# Patient Record
Sex: Female | Born: 1995 | Race: Black or African American | Hispanic: No | Marital: Single | Smoking: Never smoker
Health system: Southern US, Community
[De-identification: ages and names within clinical notes are randomized; demographics above are authoritative.]

---

## 2017-05-18 ENCOUNTER — Emergency Department (HOSPITAL_COMMUNITY)
Admission: EM | Admit: 2017-05-18 | Discharge: 2017-05-19 | Disposition: A | Discharge status: Home / Self Care | Payer: Self-pay | Attending: Emergency Medicine | Admitting: Emergency Medicine

## 2017-05-18 ENCOUNTER — Emergency Department (HOSPITAL_COMMUNITY): Payer: Self-pay

## 2017-05-18 ENCOUNTER — Encounter (HOSPITAL_COMMUNITY): Payer: Self-pay

## 2017-05-18 DIAGNOSIS — R0602 Shortness of breath: Secondary | ICD-10-CM | POA: Insufficient documentation

## 2017-05-18 DIAGNOSIS — R079 Chest pain, unspecified: Secondary | ICD-10-CM | POA: Insufficient documentation

## 2017-05-18 NOTE — ED Triage Notes (Signed)
Pt complains of chest wall pain across her chest for several weeks, sometimes she states that she's short of breath Pt smells of strongly of marijuana but denies use
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
## 2017-05-19 LAB — D-DIMER, QUANTITATIVE: D-Dimer, Quant: 0.28 ug/mL-FEU (ref 0.00–0.50)

## 2017-05-19 LAB — BASIC METABOLIC PANEL
Anion gap: 8 (ref 5–15)
BUN: 11 mg/dL (ref 6–20)
CHLORIDE: 108 mmol/L (ref 101–111)
CO2: 22 mmol/L (ref 22–32)
CREATININE: 0.76 mg/dL (ref 0.44–1.00)
Calcium: 9.2 mg/dL (ref 8.9–10.3)
GFR calc Af Amer: >60 mL/min (ref >60)
GFR calc non Af Amer: >60 mL/min (ref >60)
GLUCOSE: 88 mg/dL (ref 65–99)
POTASSIUM: 3.6 mmol/L (ref 3.5–5.1)
Sodium: 138 mmol/L (ref 135–145)

## 2017-05-19 LAB — CBC
HCT: 40.6 % (ref 36.0–46.0)
Hemoglobin: 13.7 g/dL (ref 12.0–15.0)
MCH: 31.5 pg (ref 26.0–34.0)
MCHC: 33.7 g/dL (ref 30.0–36.0)
MCV: 93.3 fL (ref 78.0–100.0)
Platelets: 339 10*3/uL (ref 150–400)
RBC: 4.35 MIL/uL (ref 3.87–5.11)
RDW: 13.5 % (ref 11.5–15.5)
WBC: 11.3 10*3/uL — AB (ref 4.0–10.5)

## 2017-05-19 MED ORDER — NAPROXEN 500 MG PO TABS: 500.0000 mg | ORAL_TABLET | Freq: Two times a day (BID) | ORAL | 0 refills | Status: DC
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
## 2017-05-23 ENCOUNTER — Other Ambulatory Visit: Payer: Self-pay

## 2017-05-23 ENCOUNTER — Emergency Department (HOSPITAL_COMMUNITY)
Admission: EM | Admit: 2017-05-23 | Discharge: 2017-05-23 | Disposition: A | Discharge status: Home / Self Care | Payer: Self-pay | Attending: Emergency Medicine | Admitting: Emergency Medicine

## 2017-05-23 ENCOUNTER — Encounter (HOSPITAL_COMMUNITY): Payer: Self-pay | Admitting: Emergency Medicine

## 2017-05-23 ENCOUNTER — Emergency Department (HOSPITAL_COMMUNITY): Payer: Self-pay

## 2017-05-23 DIAGNOSIS — W01198A Fall on same level from slipping, tripping and stumbling with subsequent striking against other object, initial encounter: Secondary | ICD-10-CM | POA: Insufficient documentation

## 2017-05-23 DIAGNOSIS — S0990XA Unspecified injury of head, initial encounter: Secondary | ICD-10-CM | POA: Insufficient documentation

## 2017-05-23 DIAGNOSIS — R51 Headache: Secondary | ICD-10-CM | POA: Insufficient documentation

## 2017-05-23 DIAGNOSIS — Y9389 Activity, other specified: Secondary | ICD-10-CM | POA: Insufficient documentation

## 2017-05-23 DIAGNOSIS — Y9289 Other specified places as the place of occurrence of the external cause: Secondary | ICD-10-CM | POA: Insufficient documentation

## 2017-05-23 DIAGNOSIS — R519 Headache, unspecified: Secondary | ICD-10-CM

## 2017-05-23 DIAGNOSIS — Y998 Other external cause status: Secondary | ICD-10-CM | POA: Insufficient documentation

## 2017-05-23 LAB — POC URINE PREG, ED: PREG TEST UR: NEGATIVE

## 2017-05-23 MED ORDER — IBUPROFEN 200 MG PO TABS
600.0000 mg | ORAL_TABLET | Freq: Once | ORAL | Status: AC
  Administered 2017-05-23: 600 mg via ORAL
  Filled 2017-05-23: qty 3

## 2017-05-23 MED ORDER — ACETAMINOPHEN 325 MG PO TABS
650.0000 mg | ORAL_TABLET | Freq: Once | ORAL | Status: AC
  Administered 2017-05-23: 650 mg via ORAL
  Filled 2017-05-23: qty 2
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
## 2017-05-23 NOTE — Discharge Instructions (Signed)
Please see the information and instructions below regarding your visit.  Your diagnoses today include:  1. Injury of head, initial encounter   2. Facial pain    Concussions are caused by acceleration/deceleration forces of the brain against the skull and in mild forms, cannot be seen on any imaging. The injury occurs at the microscopic level, and causes a disturbance more in function than the structure of the brain itself. Common symptoms of concussion include: ?Poor coordination such as stumbling or inability to walk in a straight line ?Vacant stare (befuddled facial expression) ?Delayed verbal expression (slower to answer questions or follow instructions) ?Inability to focus attention (easily distracted and unable to follow through with normal activities) ?Disorientation (walking in the wrong direction, unaware of time, date, place) ?Slurred or incoherent speech (making disjointed or incomprehensible statements) ?Emotionality out of proportion to circumstances (appearing distraught, crying for no apparent reason) ?Memory deficits (exhibited by patient repeatedly asking the same question that has already been answered or inability to recall three of three words after five minutes)  Tests performed today include:  See side panel of your discharge paperwork for testing performed today. Vital signs are listed at the bottom of these instructions.   Medications prescribed:    Avoid aspirin. Take only ibuprofen (Advil) or naproxen (Aleve), and acetaminophen (Tylenol).  Take any prescribed medications only as prescribed, and any over the counter medications only as directed on the packaging.  Home care instructions:  Please follow any educational materials contained in this packet.    Keep head elevated at all times for the first 24 hours (Elevate mattress if pillow is ineffective)  Do not take tranquilizers, sedatives, narcotics or alcohol  Use ice packs for comfort Have parent, spouse,  or friend awaken patient every  Follow-up instructions: Please follow-up with your primary care provider in as soon as possible for further evaluation of your symptoms if they are not completely improved.   Return instructions:  Please return to the Emergency Department if you experience worsening symptoms.   If any of the following occur notify your physician or go to the Hospital Emergency Department:  Increased drowsiness, confusion, or loss of consciousness  Restlessness or convulsions (fits)  Paralysis in arms or legs  Temperature above 100 F  Vomiting  Severe headache  Blood or clear fluid dripping from the nose or ears  Stiffness of the neck  Dizziness or blurred vision  Pulsating pain in the eye  Unequal pupils of eye  Personality changes  Any other unusual symptoms  Please return if you have any other emergent concerns.  Additional Information:   Your vital signs today were: BP 140/83 (BP Location: Left Arm)    Pulse 95    Temp 98.2 F (36.8 C) (Oral)    Resp 15    Ht 5\' 2"  (1.575 m)    Wt 68 kg (150 lb)    LMP 04/27/2017    SpO2 94%    BMI 27.44 kg/m  If your blood pressure (BP) was elevated on multiple readings during this visit above 130 for the top number or above 80 for the bottom number, please have this repeated by your primary care provider within one month. --------------  Thank you for allowing us to participate in your care today. It was my pleasure to care for you!

## 2017-05-23 NOTE — ED Triage Notes (Signed)
Patient tripped over some wires and fell. Patient hit her head on the left side. Patient has some redness on her left cheek. Patient states she is scared to go to sleep due to her hitting her head.
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
## 2017-06-08 ENCOUNTER — Other Ambulatory Visit: Payer: Self-pay

## 2017-06-08 ENCOUNTER — Encounter (HOSPITAL_COMMUNITY): Payer: Self-pay | Admitting: Emergency Medicine

## 2017-06-08 DIAGNOSIS — Z5321 Procedure and treatment not carried out due to patient leaving prior to being seen by health care provider: Secondary | ICD-10-CM | POA: Insufficient documentation

## 2017-06-08 LAB — COMPREHENSIVE METABOLIC PANEL
ALBUMIN: 4 g/dL (ref 3.5–5.0)
ALK PHOS: 71 U/L (ref 38–126)
ALT: 21 U/L (ref 14–54)
ANION GAP: 8 (ref 5–15)
AST: 27 U/L (ref 15–41)
BUN: 11 mg/dL (ref 6–20)
CO2: 26 mmol/L (ref 22–32)
Calcium: 9.3 mg/dL (ref 8.9–10.3)
Chloride: 103 mmol/L (ref 101–111)
Creatinine, Ser: 0.72 mg/dL (ref 0.44–1.00)
GFR calc Af Amer: >60 mL/min (ref >60)
GFR calc non Af Amer: >60 mL/min (ref >60)
GLUCOSE: 95 mg/dL (ref 65–99)
POTASSIUM: 3.6 mmol/L (ref 3.5–5.1)
SODIUM: 137 mmol/L (ref 135–145)
Total Bilirubin: 0.2 mg/dL — ABNORMAL LOW (ref 0.3–1.2)
Total Protein: 7.7 g/dL (ref 6.5–8.1)

## 2017-06-08 LAB — CBC
HEMATOCRIT: 40.4 % (ref 36.0–46.0)
HEMOGLOBIN: 13.4 g/dL (ref 12.0–15.0)
MCH: 31.3 pg (ref 26.0–34.0)
MCHC: 33.2 g/dL (ref 30.0–36.0)
MCV: 94.4 fL (ref 78.0–100.0)
PLATELETS: 369 10*3/uL (ref 150–400)
RBC: 4.28 MIL/uL (ref 3.87–5.11)
RDW: 13.6 % (ref 11.5–15.5)
WBC: 13.3 10*3/uL — ABNORMAL HIGH (ref 4.0–10.5)

## 2017-06-08 LAB — LIPASE, BLOOD: LIPASE: 28 U/L (ref 11–51)

## 2017-06-08 LAB — I-STAT CG4 LACTIC ACID, ED: LACTIC ACID, VENOUS: 1.33 mmol/L (ref 0.5–1.9)

## 2017-06-08 NOTE — ED Notes (Signed)
Patient was called for an assigned room with no answer.  

## 2017-06-08 NOTE — ED Triage Notes (Signed)
Called pt from lobby No respsonse 

## 2017-06-08 NOTE — ED Triage Notes (Signed)
CALLED PT FOR RECHECK OF V/S NO RESPONSE PT SEEN LEAVING THE ED
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
## 2017-06-08 NOTE — ED Triage Notes (Signed)
PT HAS URINE SAMPLE IN TRIAGE WHEN NEEDED

## 2017-06-08 NOTE — ED Notes (Signed)
Pt called  No response from lobby  

## 2017-06-09 ENCOUNTER — Emergency Department (HOSPITAL_COMMUNITY)
Admission: EM | Admit: 2017-06-09 | Discharge: 2017-06-09 | Discharge status: Against Medical Advice | Payer: Self-pay | Attending: Emergency Medicine | Admitting: Emergency Medicine

## 2017-07-27 ENCOUNTER — Ambulatory Visit (HOSPITAL_COMMUNITY)
Admission: EM | Admit: 2017-07-27 | Discharge: 2017-07-27 | Disposition: A | Discharge status: Home / Self Care | Payer: Self-pay | Attending: Family Medicine | Admitting: Family Medicine

## 2017-07-27 ENCOUNTER — Encounter (HOSPITAL_COMMUNITY): Payer: Self-pay | Admitting: Emergency Medicine

## 2017-07-27 DIAGNOSIS — K529 Noninfective gastroenteritis and colitis, unspecified: Secondary | ICD-10-CM

## 2017-07-27 MED ORDER — ONDANSETRON 4 MG PO TBDP: 4.0000 mg | ORAL_TABLET | Freq: Three times a day (TID) | ORAL | 0 refills | Status: DC | PRN

## 2017-07-27 NOTE — ED Provider Notes (Signed)
[facility]   [id number] 07/27/17 Arrival Time: 1413  ASSESSMENT & PLAN:  1. Gastroenteritis     Meds ordered this encounter  Medications  . ondansetron (ZOFRAN-ODT) 4 MG disintegrating tablet    Sig: Take 1 tablet (4 mg total) by mouth every 8 (eight) hours as needed for nausea or vomiting.    Dispense:  15 tablet    Refill:  0   Discussed typical duration of symptoms for suspected viral GI illness. Will do her best to ensure adequate fluid intake in order to avoid dehydration. Will proceed to the Emergency Department for evaluation if unable to tolerate PO fluids regularly.  Otherwise she will f/u with his PCP or here if not showing improvement over the next 48-72 hours.  Reviewed expectations re: course of current medical issues. Questions answered. Outlined signs and symptoms indicating need for more acute intervention. Patient verbalized understanding. After Visit Summary given.   SUBJECTIVE: History from: patient.  Kara Benitez is a 22 y.o. female who presents with complaint of non-bloody intermittent nausea and vomiting of brown material with loose, non-bloody stools. Onset abrupt, 2 days ago. Abdominal discomfort: mild and cramping. Symptoms are gradually improving since beginning. Aggravating factors: eating. Alleviating factors: none. Associated symptoms: fatigue. She denies fever. Appetite: decreased. PO intake: decreased. Ambulatory without assistance. Urinary symptoms: none. OTC treatment: none.  LMP 2 weeks ago; regular.  History reviewed. No pertinent surgical history.  ROS: As per HPI.  OBJECTIVE:  Vitals:   07/27/17 1524  BP: (!) 97/56  Pulse: 100  Resp: 18  Temp: 98.2 F (36.8 C)  TempSrc: Oral  SpO2: 97%    General appearance: alert; no distress Oropharynx: moist Lungs: clear to auscultation bilaterally Heart: regular rate and rhythm Abdomen: soft; non-distended; no significant abdominal tenderness, "cramping feeling"; bowel  sounds present; no masses or organomegaly; no guarding or rebound tenderness Back: no CVA tenderness Extremities: no edema; symmetrical with no gross deformities Skin: warm and dry Neurologic: normal gait Psychological: alert and cooperative; normal mood and affect  No Known Allergies                                              Social History   Socioeconomic History  . Marital status: Single    Spouse name: Not on file  . Number of children: Not on file  . Years of education: Not on file  . Highest education level: Not on file  Occupational History  . Not on file  Social Needs  . Financial resource strain: Not on file  . Food insecurity:    Worry: Not on file    Inability: Not on file  . Transportation needs:    Medical: Not on file    Non-medical: Not on file  Tobacco Use  . Smoking status: Never Smoker  . Smokeless tobacco: Never Used  Substance and Sexual Activity  . Alcohol use: Yes    Frequency: Never  . Drug use: Yes    Types: Marijuana  . Sexual activity: Not on file  Lifestyle  . Physical activity:    Days per week: Not on file    Minutes per session: Not on file  . Stress: Not on file  Relationships  . Social connections:    Talks on phone: Not on file    Gets together: Not on file    Attends religious  service: Not on file    Active member of club or organization: Not on file    Attends meetings of clubs or organizations: Not on file    Relationship status: Not on file  . Intimate partner violence:    Fear of current or ex partner: Not on file    Emotionally abused: Not on file    Physically abused: Not on file    Forced sexual activity: Not on file  Other Topics Concern  . Not on file  Social History Narrative  . Not on file   History reviewed. No pertinent family history.   Mardella Layman, MD 07/28/17 [phone]

## 2017-07-27 NOTE — Discharge Instructions (Signed)

## 2017-07-27 NOTE — ED Triage Notes (Signed)
Pt here for vomiting x 2 days  

## 2017-09-08 ENCOUNTER — Encounter (HOSPITAL_COMMUNITY): Payer: Self-pay | Admitting: *Deleted

## 2017-09-08 ENCOUNTER — Emergency Department (HOSPITAL_COMMUNITY)
Admission: EM | Admit: 2017-09-08 | Discharge: 2017-09-08 | Disposition: A | Discharge status: Home / Self Care | Payer: Self-pay | Attending: Emergency Medicine | Admitting: Emergency Medicine

## 2017-09-08 ENCOUNTER — Other Ambulatory Visit: Payer: Self-pay

## 2017-09-08 DIAGNOSIS — J029 Acute pharyngitis, unspecified: Secondary | ICD-10-CM | POA: Insufficient documentation

## 2017-09-08 DIAGNOSIS — Z79899 Other long term (current) drug therapy: Secondary | ICD-10-CM | POA: Insufficient documentation

## 2017-09-08 LAB — GROUP A STREP BY PCR: GROUP A STREP BY PCR: NOT DETECTED

## 2017-09-08 MED ORDER — DEXAMETHASONE SODIUM PHOSPHATE 10 MG/ML IJ SOLN
10.0000 mg | Freq: Once | INTRAMUSCULAR | Status: AC
  Administered 2017-09-08: 10 mg via INTRAMUSCULAR
  Filled 2017-09-08: qty 1

## 2017-09-08 NOTE — ED Triage Notes (Signed)
Pt c/o sore throat since yesterday.  Pt stated "I feel like something in there is bruised."

## 2017-09-08 NOTE — Discharge Instructions (Addendum)
Take Tylenol or Motrin for your sore throat.  Drink plenty of fluids.  Follow-up with your doctor as needed.  Return if worsening symptoms.  You were given a shot of steroids which should help with the swelling in your throat as well as her headache.
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
## 2017-09-08 NOTE — ED Notes (Signed)
Pt attempted to sign, signature pad not working. Pt verbalizes understanding d/c paperwork.

## 2017-09-14 ENCOUNTER — Encounter (HOSPITAL_COMMUNITY): Payer: Self-pay | Admitting: Emergency Medicine

## 2017-09-14 ENCOUNTER — Emergency Department (HOSPITAL_COMMUNITY)
Admission: EM | Admit: 2017-09-14 | Discharge: 2017-09-14 | Disposition: A | Discharge status: Home / Self Care | Payer: Self-pay | Attending: Emergency Medicine | Admitting: Emergency Medicine

## 2017-09-14 ENCOUNTER — Emergency Department (HOSPITAL_COMMUNITY): Payer: Self-pay

## 2017-09-14 DIAGNOSIS — J029 Acute pharyngitis, unspecified: Secondary | ICD-10-CM | POA: Insufficient documentation

## 2017-09-14 DIAGNOSIS — M542 Cervicalgia: Secondary | ICD-10-CM | POA: Insufficient documentation
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
## 2017-09-14 NOTE — ED Notes (Signed)
Patient verbalized understanding of discharge instructions, no questions. Patient ambulated out of ED with steady gait in no distress.  

## 2017-09-14 NOTE — Discharge Instructions (Signed)
Return if any problems.

## 2018-04-21 ENCOUNTER — Encounter (HOSPITAL_COMMUNITY): Payer: Self-pay | Admitting: Emergency Medicine

## 2018-04-21 ENCOUNTER — Ambulatory Visit (HOSPITAL_COMMUNITY)
Admission: EM | Admit: 2018-04-21 | Discharge: 2018-04-21 | Disposition: A | Discharge status: Home / Self Care | Payer: Self-pay | Attending: Family Medicine | Admitting: Family Medicine

## 2018-04-21 DIAGNOSIS — R0789 Other chest pain: Secondary | ICD-10-CM

## 2018-04-21 MED ORDER — DICLOFENAC SODIUM 1 % TD GEL: 2.0000 g | Freq: Four times a day (QID) | TRANSDERMAL | 1 refills | Status: AC

## 2018-04-21 MED ORDER — KETOROLAC TROMETHAMINE 60 MG/2ML IM SOLN
60.0000 mg | Freq: Once | INTRAMUSCULAR | Status: AC
  Administered 2018-04-21: 60 mg via INTRAMUSCULAR

## 2018-04-21 MED ORDER — MELOXICAM 7.5 MG PO TABS: 7.5000 mg | ORAL_TABLET | Freq: Two times a day (BID) | ORAL | 1 refills | Status: AC | PRN

## 2018-04-21 MED ORDER — KETOROLAC TROMETHAMINE 60 MG/2ML IM SOLN
INTRAMUSCULAR | Status: AC
  Filled 2018-04-21: qty 2

## 2018-04-21 NOTE — ED Provider Notes (Signed)
[facility]    CSN: [id number] Arrival date & time: 04/21/18  1229     History   Chief Complaint Chief Complaint  Patient presents with  . Chest Pain    HPI Kara Benitez is a 23 y.o. female.   23 year old female accompanied by a significant other with concern over upper bilateral chest pain and soreness that occurs intermittently for the past 2 years. Pain starts in the upper right and left chest and remains constant with occasional sharp pain. Pain started last night and woke her up. Other symptoms include feeling short of breath, hot flashes and throat feeling tight. Denies any fever, nasal congestion, sore throat, cough, nausea, vomiting or diarrhea. Occasionally will have dizziness with the pain and today felt dizzy at work and almost passed out. She has been evaluated in the past in the ER in [place] and moved here to [place] about 1 year ago. She has been evaluated in the ER at [facility] in Feb 2019 for similar symptoms. In the past her ECG, chest x-ray and labwork have all been normal. She has even had a MRI of her chest in [place] and patient indicates results were "normal". She has been given an anti-inflammatory in the past (uncertain of name) with minimal relief. She does not smoke and rarely drinks alcohol. She denies any recent illicit drug use (has used marijuana in the past). No family history of cardiac or lung issues. Mom has history of anxiety. Patient does not have a PCP yet and is frustrated that "no one can figure out what is wrong with me".    The history is provided by the patient.    History reviewed. No pertinent past medical history.  There are no active problems to display for this patient.   History reviewed. No pertinent surgical history.  OB History   No obstetric history on file.      Home Medications    Prior to Admission medications   Medication Sig Start Date End Date Taking? Authorizing Provider  diclofenac sodium  (VOLTAREN) 1 % GEL Apply 2 g topically 4 (four) times daily. To affected area as needed 04/21/18   Sudie Grumbling, NP  meloxicam (MOBIC) 7.5 MG tablet Take 1 tablet (7.5 mg total) by mouth 2 (two) times daily as needed for pain. 04/21/18   Sudie Grumbling, NP    Family History No family history on file.  Social History Social History   Tobacco Use  . Smoking status: Never Smoker  . Smokeless tobacco: Never Used  Substance Use Topics  . Alcohol use: Yes    Frequency: Never  . Drug use: Yes    Types: Marijuana     Allergies   Patient has no known allergies.   Review of Systems Review of Systems  Constitutional: Negative for activity change, appetite change, chills, diaphoresis, fatigue, fever and unexpected weight change.  HENT: Negative for congestion, facial swelling, mouth sores, nosebleeds, postnasal drip, sore throat and trouble swallowing.   Eyes: Negative for photophobia and visual disturbance.  Respiratory: Positive for chest tightness and shortness of breath. Negative for cough, wheezing and stridor.   Cardiovascular: Positive for chest pain. Negative for palpitations and leg swelling.  Gastrointestinal: Negative for abdominal pain, diarrhea, nausea and vomiting.  Genitourinary: Negative for decreased urine volume, difficulty urinating, flank pain and hematuria.  Musculoskeletal: Negative for arthralgias, back pain, myalgias, neck pain and neck stiffness.  Skin: Negative for color change, rash and wound.  Allergic/Immunologic:  Negative for environmental allergies and immunocompromised state.  Neurological: Positive for dizziness and light-headedness. Negative for tremors, seizures, syncope, facial asymmetry, speech difficulty, weakness, numbness and headaches.  Hematological: Negative for adenopathy. Does not bruise/bleed easily.     Physical Exam Triage Vital Signs ED Triage Vitals  Enc Vitals Group     BP 04/21/18 1312 127/86     Pulse Rate 04/21/18 1312 87      Resp 04/21/18 1312 18     Temp 04/21/18 1312 98.3 F (36.8 C)     Temp src --      SpO2 04/21/18 1312 100 %     Weight --      Height --      Head Circumference --      Peak Flow --      Pain Score 04/21/18 1313 9     Pain Loc --      Pain Edu? --      Excl. in GC? --    No data found.  Updated Vital Signs BP 127/86   Pulse 87   Temp 98.3 F (36.8 C)   Resp 18   LMP 03/22/2018   SpO2 100%   Visual Acuity Right Eye Distance:   Left Eye Distance:   Bilateral Distance:    Right Eye Near:   Left Eye Near:    Bilateral Near:     Physical Exam Vitals signs and nursing note reviewed.  Constitutional:      General: She is awake. She is not in acute distress.    Appearance: Normal appearance. She is well-developed, well-groomed and normal weight. She is not ill-appearing.     Comments: Patient is sitting comfortably in exam chair in no acute distress but is wearing a mask to prevent exposure to flu and other illnesses from patients around her. She does appear in pain.   HENT:     Head: Normocephalic and atraumatic.     Right Ear: Hearing, tympanic membrane, ear canal and external ear normal.     Left Ear: Hearing, tympanic membrane, ear canal and external ear normal.     Nose: Nose normal.     Right Sinus: No maxillary sinus tenderness or frontal sinus tenderness.     Left Sinus: No maxillary sinus tenderness or frontal sinus tenderness.     Mouth/Throat:     Lips: Pink.     Mouth: Mucous membranes are moist.     Pharynx: Oropharynx is clear. Uvula midline. No pharyngeal swelling.  Eyes:     General: Lids are normal.     Extraocular Movements: Extraocular movements intact.     Conjunctiva/sclera: Conjunctivae normal.     Pupils: Pupils are equal, round, and reactive to light.  Neck:     Musculoskeletal: Normal range of motion and neck supple. No neck rigidity or muscular tenderness.  Cardiovascular:     Rate and Rhythm: Normal rate and regular rhythm.     Pulses:  Normal pulses.     Heart sounds: Normal heart sounds. No murmur.  Pulmonary:     Effort: Pulmonary effort is normal. No accessory muscle usage or respiratory distress.     Breath sounds: Normal breath sounds and air entry. No decreased air movement. No decreased breath sounds, wheezing, rhonchi or rales.     Comments: Slight discomfort in upper chest when taking a deep breath.  Chest:     Chest wall: Tenderness present. No mass, deformity, swelling, crepitus or edema.  Comments: Tender to palpation along upper sternal area across 2nd-3rd ribs bilaterally. No distinct swelling or redness present.  Musculoskeletal: Normal range of motion.     Right lower leg: No edema.     Left lower leg: No edema.  Lymphadenopathy:     Cervical: No cervical adenopathy.  Skin:    General: Skin is warm and dry.     Capillary Refill: Capillary refill takes less than 2 seconds.     Findings: No rash.  Neurological:     General: No focal deficit present.     Mental Status: She is alert and oriented to person, place, and time.     Cranial Nerves: Cranial nerves are intact.     Sensory: Sensation is intact.     Motor: Motor function is intact.     Coordination: Coordination is intact.     Gait: Gait is intact.     Deep Tendon Reflexes: Reflexes are normal and symmetric.  Psychiatric:        Attention and Perception: Attention normal.        Mood and Affect: Mood and affect normal.        Speech: Speech normal.        Behavior: Behavior normal. Behavior is cooperative.        Thought Content: Thought content normal.        Cognition and Memory: Cognition normal.      UC Treatments / Results  Labs (all labs ordered are listed, but only abnormal results are displayed) Labs Reviewed - No data to display  EKG None  Radiology No results found.  Procedures Procedures (including critical care time)  Medications Ordered in UC Medications  ketorolac (TORADOL) injection 60 mg (60 mg  Intramuscular Given 04/21/18 1412)    Initial Impression / Assessment and Plan / UC Course  I have reviewed the triage vital signs and the nursing notes.  Pertinent labs & imaging results that were available during my care of the patient were reviewed by me and considered in my medical decision making (see chart for details).    Discussed with patient and significant other uncertain of cause of etiology. Doubt cardiac or pulmonary issue since pain has been occurring off and on for over 2 years and previous imaging and testing have all been normal. Patient is stable today and in no acute distress. Discussed that she needs additional work-up to determine cause of symptoms. May be musculoskeletal or anxiety attack. Gave Toradol 60mg  IM now to help with pain. Patient indicated within 10 minutes, pain had improved. May trial Voltaren gel- apply 3 to 4 times to affected areas on her chest as needed. If Voltaren gel is not helpful, may trial Mobic 7.5mg  twice a day as directed. May need to consider a trial of anti-anxiety medication. Recommend call [facility] today to schedule appointment for further evaluation. Note written for work. If pain persists or worsens, or increased dizziness, difficulty breathing or nausea/vomiting occur, go to the ER ASAP. Otherwise, follow-up with a PCP as planned.  Final Clinical Impressions(s) / UC Diagnoses   Final diagnoses:  Chest wall pain  Atypical chest pain     Discharge Instructions     You were given a shot of Toradol today to help with pain and inflammation. Recommend trial Voltaren gel- apply 3 to 4 times a day to affected areas as needed. If not helpful, may also try Mobic twice a day as directed. Continue to monitor symptoms. Recommend call  [facility] to schedule appointment to become established as a patient. If pain persists or gets worse, go to the ER ASAP. Otherwise, follow-up with the PCP as planned.     ED  Prescriptions    Medication Sig Dispense Auth. Provider   diclofenac sodium (VOLTAREN) 1 % GEL Apply 2 g topically 4 (four) times daily. To affected area as needed 100 g Sudie Grumbling, NP   meloxicam (MOBIC) 7.5 MG tablet Take 1 tablet (7.5 mg total) by mouth 2 (two) times daily as needed for pain. 30 tablet Sudie Grumbling, NP     Controlled Substance Prescriptions [place] Controlled Substance Registry consulted? Not Applicable   Sudie Grumbling, NP 04/22/18 [phone]

## 2018-04-21 NOTE — Discharge Instructions (Addendum)
You were given a shot of Toradol today to help with pain and inflammation. Recommend trial Voltaren gel- apply 3 to 4 times a day to affected areas as needed. If not helpful, may also try Mobic twice a day as directed. Continue to monitor symptoms. Recommend call [facility] to schedule appointment to become established as a patient. If pain persists or gets worse, go to the ER ASAP. Otherwise, follow-up with the PCP as planned.

## 2018-04-21 NOTE — ED Triage Notes (Signed)
Pt c/o chest heaviness and soreness x2 years, states shes had EKGs, MRIs and "nobody can figure out what it is".

## 2018-06-06 ENCOUNTER — Emergency Department (HOSPITAL_COMMUNITY)
Admission: EM | Admit: 2018-06-06 | Discharge: 2018-06-06 | Disposition: A | Discharge status: Home / Self Care | Payer: Self-pay | Attending: Emergency Medicine | Admitting: Emergency Medicine

## 2018-06-06 ENCOUNTER — Other Ambulatory Visit: Payer: Self-pay

## 2018-06-06 ENCOUNTER — Encounter (HOSPITAL_COMMUNITY): Payer: Self-pay

## 2018-06-06 DIAGNOSIS — R070 Pain in throat: Secondary | ICD-10-CM | POA: Insufficient documentation

## 2018-06-06 DIAGNOSIS — F419 Anxiety disorder, unspecified: Secondary | ICD-10-CM | POA: Insufficient documentation

## 2018-06-06 DIAGNOSIS — Z79899 Other long term (current) drug therapy: Secondary | ICD-10-CM | POA: Insufficient documentation

## 2018-06-06 MED ORDER — LIDOCAINE VISCOUS HCL 2 % MT SOLN: 15.0000 mL | OROMUCOSAL | 0 refills | Status: AC | PRN

## 2018-06-06 MED ORDER — LIDOCAINE VISCOUS HCL 2 % MT SOLN
15.0000 mL | Freq: Once | OROMUCOSAL | Status: AC
  Administered 2018-06-06: 15 mL via OROMUCOSAL
  Filled 2018-06-06: qty 15

## 2018-06-06 NOTE — ED Triage Notes (Signed)
Patient states she has tightness of her throat and feels like she can not get a breath at times. Patient states she has had this before. Patient states she has anxiety

## 2018-06-06 NOTE — Discharge Instructions (Signed)
Thank you for allowing me to care for you today. Please return to the emergency department if you have new or worsening symptoms. Take your medications as instructed.  ° °

## 2018-06-06 NOTE — ED Notes (Signed)
Bed: WA10 Expected date:  Expected time:  Means of arrival:  Comments: COVID 

## 2018-06-06 NOTE — ED Provider Notes (Signed)
[facility]-EMERGENCY DEPT Provider Note   CSN: [id number] Arrival date & time: 06/06/18  1059    History   Chief Complaint Chief Complaint  Patient presents with   Shortness of Breath   throat tightness    HPI Kara Benitez is a 23 y.o. female.     Patient is a 46 old female with no past medical history presents emergency department for tightness in the throat and shortness of breath.  She reports that this happened yesterday when she got upset.  Reports that she was evaluated for the same in the past and was told it was anxiety.  Reports that when she calmed down the symptoms went away.  Reports that she got upset and felt like her throat was spasming or tightening and she could not breathe and she was short of breath and had some tightness going into her chest.  This is very short-lived.  She was told to follow-up with primary care but reports that she has not done this yet. She denies any fever, chills, nausea, vomiting, cough or URI symptoms.  Currently not having any symptoms.     History reviewed. No pertinent past medical history.  There are no active problems to display for this patient.   History reviewed. No pertinent surgical history.   OB History   No obstetric history on file.      Home Medications    Prior to Admission medications   Medication Sig Start Date End Date Taking? Authorizing Provider  diclofenac sodium (VOLTAREN) 1 % GEL Apply 2 g topically 4 (four) times daily. To affected area as needed 04/21/18   Sudie Grumbling, NP  lidocaine (XYLOCAINE) 2 % solution Use as directed 15 mLs in the mouth or throat as needed for up to 7 days for mouth pain. 06/06/18 06/13/18  Arlyn Dunning, PA-C  meloxicam (MOBIC) 7.5 MG tablet Take 1 tablet (7.5 mg total) by mouth 2 (two) times daily as needed for pain. 04/21/18   AmyotAli Lowe, NP    Family History Family History  Problem Relation Age of Onset   Thyroid disease Mother      Social History Social History   Tobacco Use   Smoking status: Never Smoker   Smokeless tobacco: Never Used  Substance Use Topics   Alcohol use: Yes    Frequency: Never   Drug use: Yes    Types: Marijuana     Allergies   Patient has no known allergies.   Review of Systems Review of Systems  Constitutional: Negative.  Negative for chills and fever.  HENT: Positive for sore throat and trouble swallowing. Negative for ear pain.   Eyes: Negative for pain and visual disturbance.  Respiratory: Positive for shortness of breath. Negative for cough.   Cardiovascular: Positive for chest pain. Negative for palpitations.  Gastrointestinal: Negative for abdominal pain, nausea and vomiting.  Genitourinary: Negative for dysuria and hematuria.  Musculoskeletal: Negative for arthralgias and back pain.  Skin: Negative for color change and rash.  Neurological: Negative for dizziness, seizures and syncope.  Psychiatric/Behavioral: The patient is nervous/anxious.   All other systems reviewed and are negative.    Physical Exam Updated Vital Signs BP (!) 148/84    Pulse 83    Temp 98.9 F (37.2 C) (Oral)    Resp 16    Ht 5\' 2"  (1.575 m)    Wt 76.2 kg    LMP 05/23/2018    SpO2 100%    BMI 30.73  kg/m   Physical Exam Vitals signs and nursing note reviewed.  Constitutional:      General: She is not in acute distress.    Appearance: She is well-developed.  HENT:     Head: Normocephalic and atraumatic.     Jaw: There is normal jaw occlusion.     Mouth/Throat:     Mouth: Mucous membranes are moist. No injury or lacerations.     Tongue: No lesions. Tongue does not protrude in midline.     Palate: No mass and lesions. Palate does not elevate in midline.     Pharynx: Oropharynx is clear. Uvula midline. No pharyngeal swelling, oropharyngeal exudate, posterior oropharyngeal erythema or uvula swelling.     Tonsils: No tonsillar exudate or tonsillar abscesses.  Eyes:      Conjunctiva/sclera: Conjunctivae normal.  Neck:     Musculoskeletal: Full passive range of motion without pain and neck supple. No edema, erythema, neck rigidity, crepitus, injury, pain with movement, torticollis, spinous process tenderness or muscular tenderness.     Thyroid: No thyroid mass, thyromegaly or thyroid tenderness.     Trachea: Trachea normal.  Cardiovascular:     Rate and Rhythm: Normal rate and regular rhythm.     Heart sounds: No murmur.  Pulmonary:     Effort: Pulmonary effort is normal. No respiratory distress.     Breath sounds: Normal breath sounds.  Abdominal:     Palpations: Abdomen is soft.     Tenderness: There is no abdominal tenderness.  Lymphadenopathy:     Cervical: No cervical adenopathy.     Right cervical: No superficial, deep or posterior cervical adenopathy.    Left cervical: No superficial, deep or posterior cervical adenopathy.  Skin:    General: Skin is warm and dry.  Neurological:     Mental Status: She is alert.      ED Treatments / Results  Labs (all labs ordered are listed, but only abnormal results are displayed) Labs Reviewed - No data to display  EKG None  Radiology No results found.  Procedures Procedures (including critical care time)  Medications Ordered in ED Medications  lidocaine (XYLOCAINE) 2 % viscous mouth solution 15 mL (15 mLs Mouth/Throat Given 06/06/18 1147)     Initial Impression / Assessment and Plan / ED Course  I have reviewed the triage vital signs and the nursing notes.  Pertinent labs & imaging results that were available during my care of the patient were reviewed by me and considered in my medical decision making (see chart for details).  Clinical Course as of Jun 05 1148  Mon Jun 06, 2018  1139 Patient reports that she has been having intermittent periods of her throat "spasming" and feeling like her throat is swelling up.  She admits that she was diagnosed with anxiety and this happens when she gets  anxious and worked up. It is not associated with food or eating.  She is currently not having any symptoms but reports that she would like something that might help with this when she has these episodes.  Reports that sx go away when she calms down.  Reports that she really would like a referral to a primary care doctor as well as a specialist for further investigation into her throat. She had a CT soft tissue of her neck less than one year ago which was normal. I think her symptoms are attributed to anxiety and she has a normal physical exam today and tolerating PO just fine. She insisted  on further workup to "make sure there is nothing there" in her throat. I suggested f/u PMD for possible swallow study and maybe repeat imaging if continued symptoms . However, the patient is asymptomatic and normal at today.   [KM]    Clinical Course User Index [KM] Arlyn DunningMcLean, Josafat Enrico A, PA-C       Based on review of vitals, medical screening exam, lab work and/or imaging, there does not appear to be an acute, emergent etiology for the patient's symptoms. Counseled pt on good return precautions and encouraged both PCP and ED follow-up as needed.    Clinical Impression: 1. Anxiety   2. Throat pain     Disposition: Discharge   This note was prepared with assistance of Dragon voice recognition software. Occasional wrong-word or sound-a-like substitutions may have occurred due to the inherent limitations of voice recognition software.   Final Clinical Impressions(s) / ED Diagnoses   Final diagnoses:  Anxiety  Throat pain    ED Discharge Orders         Ordered    lidocaine (XYLOCAINE) 2 % solution  As needed     06/06/18 1150           Jeral PinchMcLean, Kyngston Pickelsimer A, PA-C 06/06/18 1150    Derwood KaplanNanavati, Ankit, MD 06/08/18 1212

## 2019-04-07 IMAGING — CR DG CHEST 2V
2 series · 2 of 2 positions shown · non-contrast
Comparison: None.

CLINICAL DATA: Chest pain

EXAM:
CHEST  2 VIEW

[w chest pa]
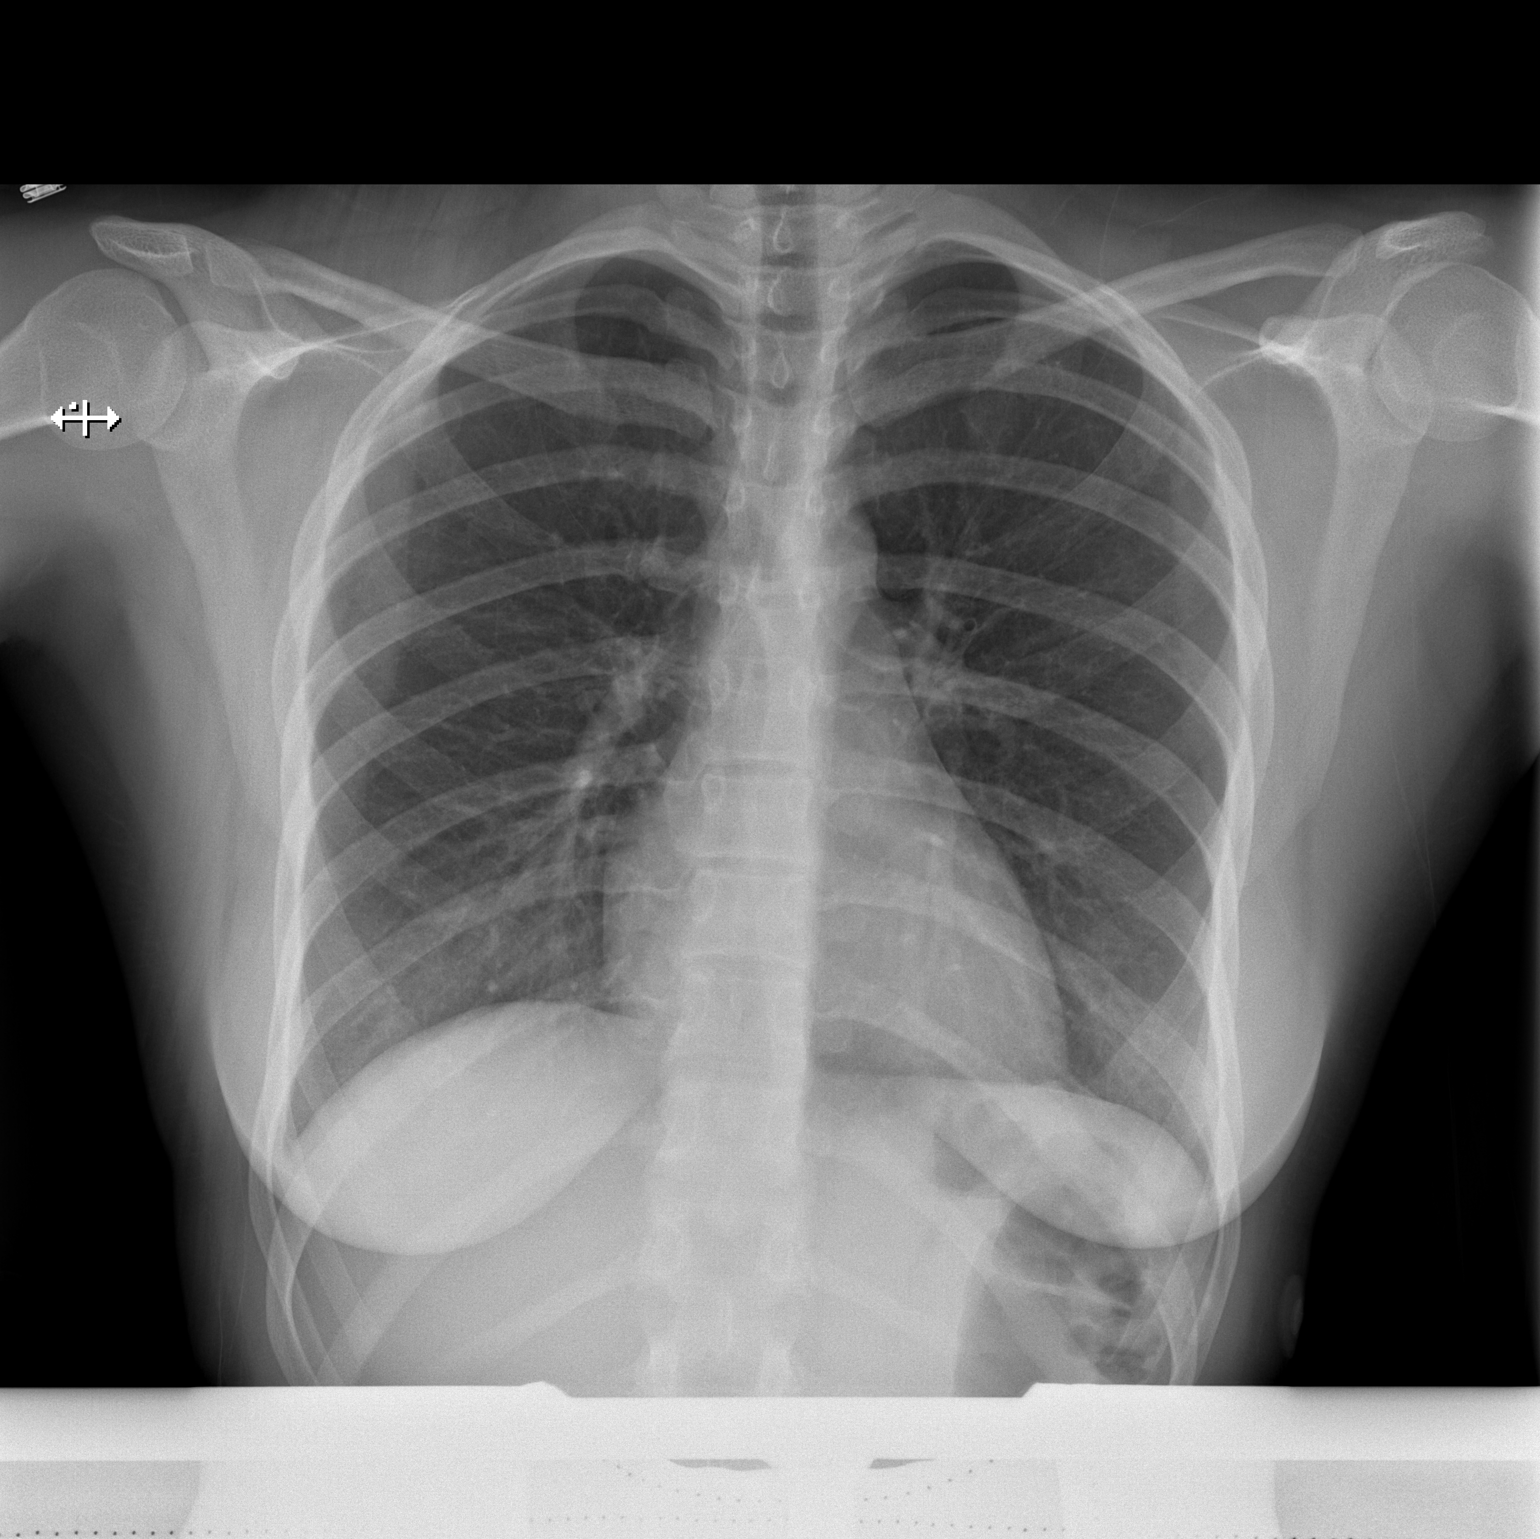

[w chest lat]
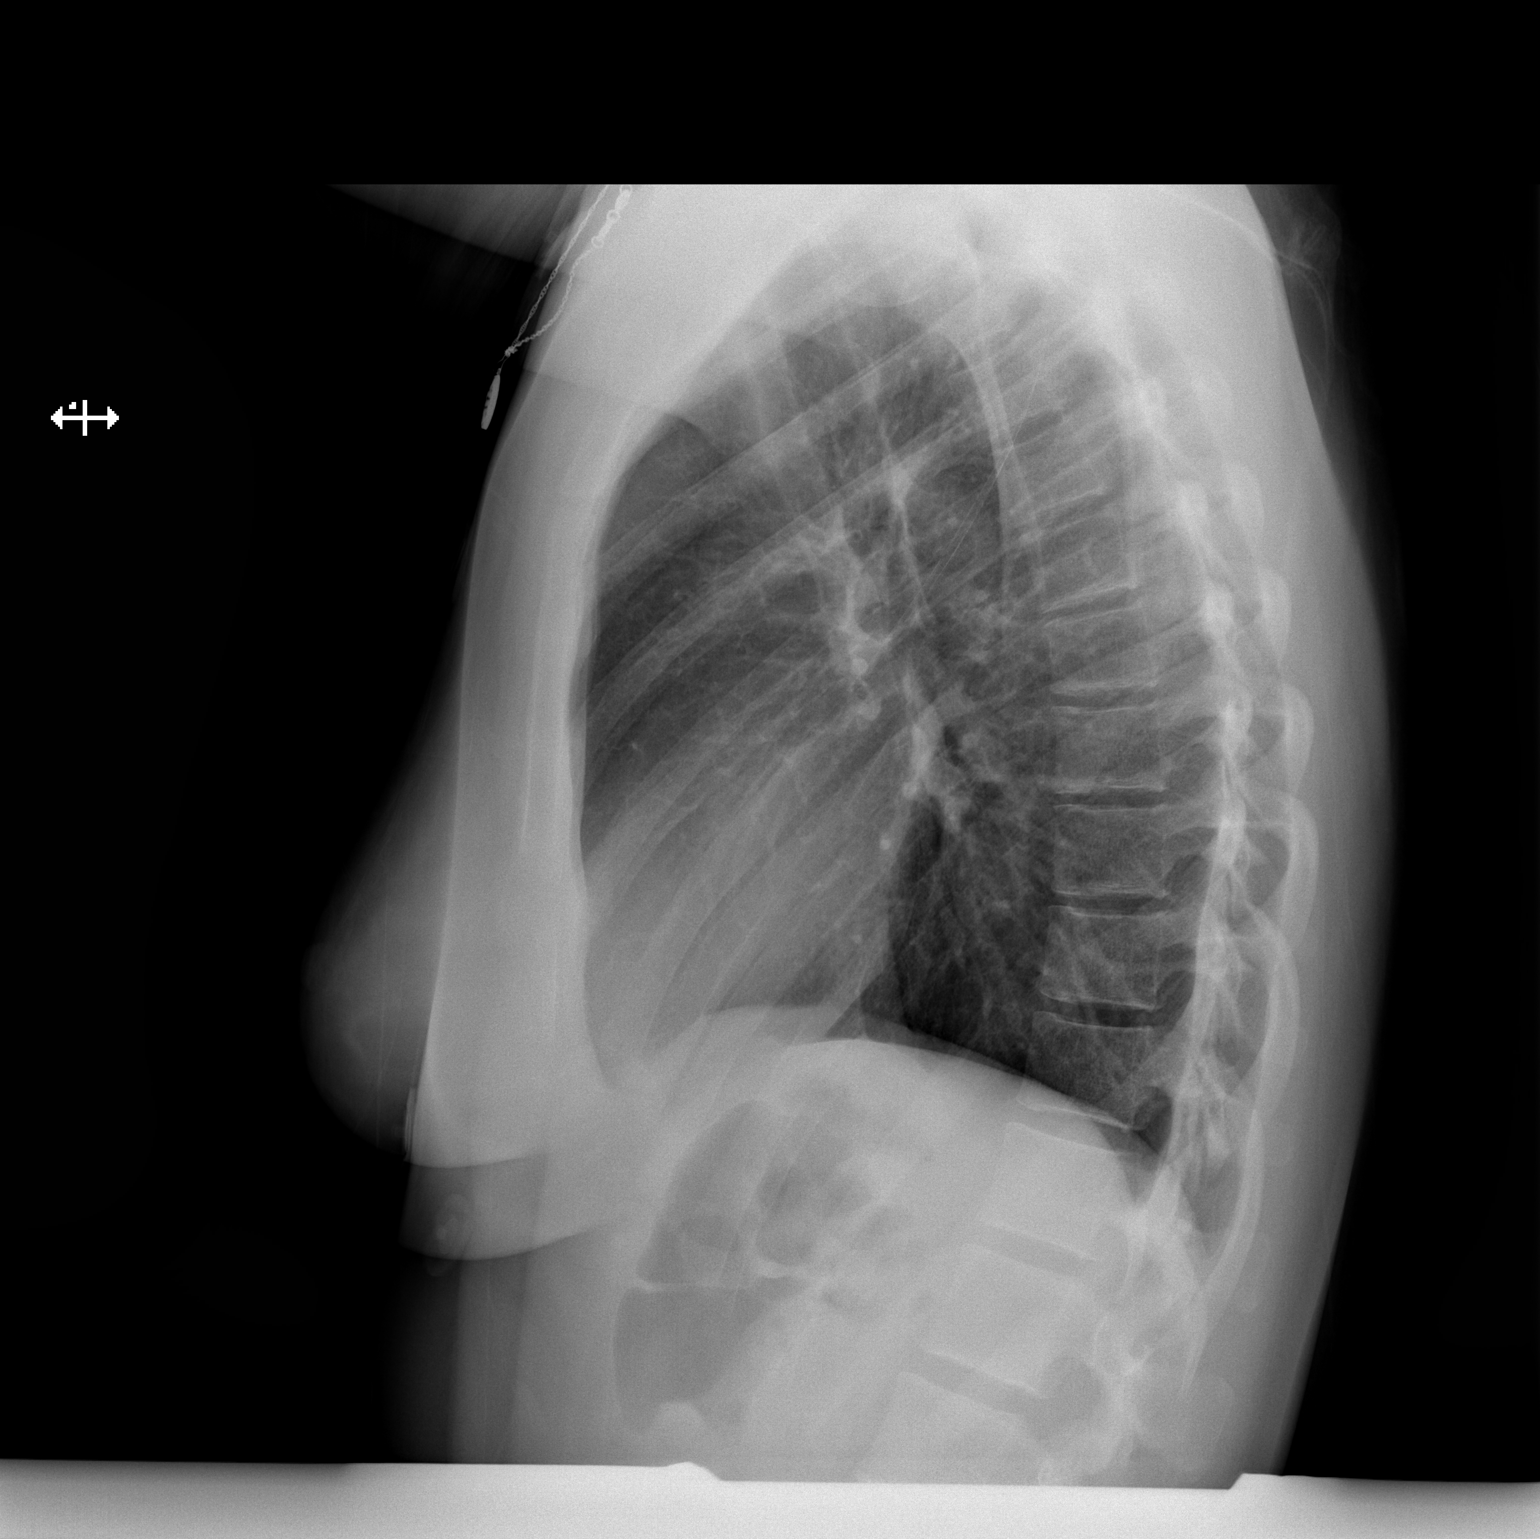

[2 of 2 positions shown; findings below may reference images not displayed]

FINDINGS: The heart size and mediastinal contours are within normal limits.
Both lungs are clear. The visualized skeletal structures are
unremarkable.
IMPRESSION: No active cardiopulmonary disease.
